# Patient Record
Sex: Female | Born: 1950 | Race: White | Hispanic: No | Marital: Married | Smoking: Never smoker
Health system: Southern US, Community
[De-identification: ages and names within clinical notes are randomized; demographics above are authoritative.]

## PROBLEM LIST (undated history)

## (undated) DIAGNOSIS — K222 Esophageal obstruction: Secondary | ICD-10-CM

## (undated) DIAGNOSIS — I498 Other specified cardiac arrhythmias: Secondary | ICD-10-CM

## (undated) DIAGNOSIS — R87619 Unspecified abnormal cytological findings in specimens from cervix uteri: Secondary | ICD-10-CM

## (undated) DIAGNOSIS — Z87440 Personal history of urinary (tract) infections: Secondary | ICD-10-CM

## (undated) DIAGNOSIS — M1990 Unspecified osteoarthritis, unspecified site: Secondary | ICD-10-CM

## (undated) DIAGNOSIS — N809 Endometriosis, unspecified: Secondary | ICD-10-CM

## (undated) DIAGNOSIS — K589 Irritable bowel syndrome without diarrhea: Secondary | ICD-10-CM

## (undated) DIAGNOSIS — IMO0002 Reserved for concepts with insufficient information to code with codable children: Secondary | ICD-10-CM

## (undated) DIAGNOSIS — Z800 Family history of malignant neoplasm of digestive organs: Secondary | ICD-10-CM

## (undated) DIAGNOSIS — K219 Gastro-esophageal reflux disease without esophagitis: Secondary | ICD-10-CM

## (undated) DIAGNOSIS — D649 Anemia, unspecified: Secondary | ICD-10-CM

## (undated) DIAGNOSIS — K449 Diaphragmatic hernia without obstruction or gangrene: Secondary | ICD-10-CM

## (undated) DIAGNOSIS — D219 Benign neoplasm of connective and other soft tissue, unspecified: Secondary | ICD-10-CM

## (undated) HISTORY — DX: Reserved for concepts with insufficient information to code with codable children: IMO0002

## (undated) HISTORY — PX: ERCP: SHX60

## (undated) HISTORY — DX: Benign neoplasm of connective and other soft tissue, unspecified: D21.9

## (undated) HISTORY — DX: Unspecified abnormal cytological findings in specimens from cervix uteri: R87.619

## (undated) HISTORY — DX: Personal history of urinary (tract) infections: Z87.440

## (undated) HISTORY — DX: Gastro-esophageal reflux disease without esophagitis: K21.9

## (undated) HISTORY — DX: Anemia, unspecified: D64.9

## (undated) HISTORY — PX: TONSILLECTOMY AND ADENOIDECTOMY: SUR1326

## (undated) HISTORY — PX: UPPER GASTROINTESTINAL ENDOSCOPY: SHX188

## (undated) HISTORY — DX: Esophageal obstruction: K22.2

## (undated) HISTORY — DX: Diaphragmatic hernia without obstruction or gangrene: K44.9

## (undated) HISTORY — PX: COLONOSCOPY: SHX174

## (undated) HISTORY — DX: Irritable bowel syndrome, unspecified: K58.9

## (undated) HISTORY — DX: Family history of malignant neoplasm of digestive organs: Z80.0

## (undated) HISTORY — DX: Endometriosis, unspecified: N80.9

## (undated) HISTORY — DX: Other specified cardiac arrhythmias: I49.8

## (undated) HISTORY — PX: LAPAROSCOPY: SHX197

## (undated) HISTORY — DX: Unspecified osteoarthritis, unspecified site: M19.90

---

## 1984-10-05 HISTORY — PX: CHOLECYSTECTOMY: SHX55

## 1999-12-24 ENCOUNTER — Other Ambulatory Visit: Admission: RE | Admit: 1999-12-24 | Discharge: 1999-12-24 | Payer: Self-pay | Admitting: *Deleted

## 2000-03-21 ENCOUNTER — Ambulatory Visit (HOSPITAL_BASED_OUTPATIENT_CLINIC_OR_DEPARTMENT_OTHER): Admission: RE | Admit: 2000-03-21 | Discharge: 2000-03-21 | Payer: Self-pay | Admitting: Internal Medicine

## 2001-02-09 ENCOUNTER — Other Ambulatory Visit: Admission: RE | Admit: 2001-02-09 | Discharge: 2001-02-09 | Payer: Self-pay | Admitting: *Deleted

## 2002-02-13 ENCOUNTER — Other Ambulatory Visit: Admission: RE | Admit: 2002-02-13 | Discharge: 2002-02-13 | Payer: Self-pay | Admitting: *Deleted

## 2002-02-23 ENCOUNTER — Other Ambulatory Visit: Admission: RE | Admit: 2002-02-23 | Discharge: 2002-02-23 | Payer: Self-pay | Admitting: *Deleted

## 2003-02-15 ENCOUNTER — Other Ambulatory Visit: Admission: RE | Admit: 2003-02-15 | Discharge: 2003-02-15 | Payer: Self-pay | Admitting: *Deleted

## 2004-02-20 ENCOUNTER — Other Ambulatory Visit: Admission: RE | Admit: 2004-02-20 | Discharge: 2004-02-20 | Payer: Self-pay | Admitting: *Deleted

## 2005-02-24 ENCOUNTER — Other Ambulatory Visit: Admission: RE | Admit: 2005-02-24 | Discharge: 2005-02-24 | Payer: Self-pay | Admitting: *Deleted

## 2005-06-11 ENCOUNTER — Ambulatory Visit: Payer: Self-pay

## 2005-07-09 ENCOUNTER — Ambulatory Visit: Payer: Self-pay | Admitting: Gastroenterology

## 2005-07-22 ENCOUNTER — Ambulatory Visit: Payer: Self-pay | Admitting: Gastroenterology

## 2005-08-25 ENCOUNTER — Ambulatory Visit: Payer: Self-pay | Admitting: Gastroenterology

## 2005-09-29 ENCOUNTER — Ambulatory Visit: Payer: Self-pay | Admitting: Gastroenterology

## 2005-10-09 ENCOUNTER — Ambulatory Visit: Payer: Self-pay | Admitting: Gastroenterology

## 2006-03-02 ENCOUNTER — Other Ambulatory Visit: Admission: RE | Admit: 2006-03-02 | Discharge: 2006-03-02 | Payer: Self-pay | Admitting: Obstetrics & Gynecology

## 2007-05-16 ENCOUNTER — Other Ambulatory Visit: Admission: RE | Admit: 2007-05-16 | Discharge: 2007-05-16 | Payer: Self-pay | Admitting: Obstetrics and Gynecology

## 2008-05-30 ENCOUNTER — Other Ambulatory Visit: Admission: RE | Admit: 2008-05-30 | Discharge: 2008-05-30 | Payer: Self-pay | Admitting: Obstetrics & Gynecology

## 2008-05-31 ENCOUNTER — Ambulatory Visit: Payer: Self-pay | Admitting: Cardiovascular Disease

## 2008-06-07 ENCOUNTER — Ambulatory Visit: Payer: Self-pay | Admitting: Cardiovascular Disease

## 2008-06-07 ENCOUNTER — Encounter: Payer: Self-pay | Admitting: Cardiovascular Disease

## 2008-06-07 ENCOUNTER — Ambulatory Visit: Payer: Self-pay

## 2008-06-07 LAB — CONVERTED CEMR LAB
Free T4: 1 ng/dL (ref 0.6–1.6)
TSH: 2.27 microintl units/mL (ref 0.35–5.50)

## 2008-06-19 ENCOUNTER — Ambulatory Visit: Payer: Self-pay | Admitting: Cardiovascular Disease

## 2008-10-16 ENCOUNTER — Telehealth: Payer: Self-pay | Admitting: Gastroenterology

## 2008-10-17 DIAGNOSIS — K222 Esophageal obstruction: Secondary | ICD-10-CM

## 2008-10-17 DIAGNOSIS — K219 Gastro-esophageal reflux disease without esophagitis: Secondary | ICD-10-CM

## 2008-10-17 DIAGNOSIS — K449 Diaphragmatic hernia without obstruction or gangrene: Secondary | ICD-10-CM | POA: Insufficient documentation

## 2008-10-17 DIAGNOSIS — N809 Endometriosis, unspecified: Secondary | ICD-10-CM | POA: Insufficient documentation

## 2008-10-17 DIAGNOSIS — I498 Other specified cardiac arrhythmias: Secondary | ICD-10-CM | POA: Insufficient documentation

## 2008-10-18 ENCOUNTER — Ambulatory Visit: Payer: Self-pay | Admitting: Gastroenterology

## 2008-10-23 ENCOUNTER — Telehealth: Payer: Self-pay | Admitting: Gastroenterology

## 2008-11-06 ENCOUNTER — Telehealth: Payer: Self-pay | Admitting: Gastroenterology

## 2008-11-08 ENCOUNTER — Encounter: Payer: Self-pay | Admitting: Gastroenterology

## 2009-01-31 ENCOUNTER — Encounter: Payer: Self-pay | Admitting: Cardiovascular Disease

## 2009-02-01 ENCOUNTER — Encounter: Payer: Self-pay | Admitting: Cardiovascular Disease

## 2009-03-09 ENCOUNTER — Encounter: Payer: Self-pay | Admitting: Cardiovascular Disease

## 2010-11-02 LAB — CONVERTED CEMR LAB
BUN: 19 mg/dL
Glucose, Bld: 79 mg/dL
Potassium: 4.1 meq/L
Sodium: 140 meq/L

## 2011-02-17 NOTE — Assessment & Plan Note (Signed)
[facility]                            CARDIOLOGY OFFICE NOTE   Sherry, Roman                         MRN:          [id number]  DATE:06/19/2008                            DOB:          1951/03/25    HISTORY OF PRESENT ILLNESS:  Sherry Roman is a pleasant 60 year old  Caucasian female with no significant past medical history other than an  esophageal stricture requiring dilatation who presented to my office on  May 31, 2008, with complaints of an awareness of palpitations.  She  stated at that time that her symptoms had been occurring  1-2 times per  month, but  recently have been occurring on a daily basis.  She  describes the sensation of her heart flipping and then a sudden feeling  of fullness in her chest.  The palpitations had been occurring once per  day over the several weeks prior to her last visit.  She also noted  occasional sharp chest pain that lasted a few seconds, but had no other  associated symptoms.  She returns today for followup of her Holter  monitor and her echocardiogram.  Today, she states that she continues to  be aware of palpitations several times per week.  These episodes have  not been as frequent as they were.  She denies any chest pain, shortness  of breath, dizziness, near syncope, syncope, orthopnea, PND, or lower  extremity edema.  She states that she has tried to change her intake of  caffeinated beverages and only drinks decaffeinated beverages at home.  She does occasionally use caffeine when she is away from home.  She  continues to deny any other intake of supplemental drugs that might  cause tachycardia.   Her past medical history is unchanged from prior visit.   CURRENT MEDICATIONS:  1. Glucosamine chondroitin once daily.  2. Multivitamin once daily.  3. Fish oil once daily.  4. Vitamin E once daily.  5. Vitamin D once weekly.  6. Omeprazole 20 mg once daily.  7. Lutein 20 mg once daily.  8. Prometrium  200 mg once daily.  9. Premarin 0.625 of the cream once weekly.   REVIEW OF SYSTEMS:  As stated in the history of present illness is  otherwise negative.   PHYSICAL EXAMINATION:  GENERAL:  She is a pleasant middle-aged Caucasian  female in no acute distress.  VITAL SIGNS:  Weight is 139 pounds, blood pressure 104/72, pulse is 72  and regular, respirations are 12 and nonlabored.  NECK:  No JVD.  No carotid bruits.  SKIN:  Warm and dry.  OROPHARYNX:  Clear.  Mucous membranes are moist.  LUNGS:  Clear to auscultation bilaterally without wheezes, rhonchi, or  crackles noted.  CARDIOVASCULAR:  Regular rate and rhythm without murmurs, gallops, or  rubs noted.  ABDOMEN:  Soft and nontender.  Bowel sounds are present.  EXTREMITIES:  No evidence of edema.  Pulses are 2+ in all extremities.   DIAGNOSTIC STUDIES:  1. A 2-D surface echocardiogram performed on June 07, 2008, shows      normal left ventricular  systolic function with an ejection fraction      of 55-60%.  There were no regional wall motion abnormalities noted.      There was a small pericardial effusion noted.  There was no      significant valvular disease noted.  2. Under diagnostic studies, 24-hour Holter monitor performed on      June 07, 2008, until June 08, 2008, shows episodes of sinus      tachycardia with occasional premature atrial complexes.  There was      no ventricular ectopy noted on this study.  3. Under diagnostic studies, thyroid function test performed on      June 07, 2008, show a free T4 of 1.0 and a TSH of 2.227, both      of which were in normal range.   ASSESSMENT AND PLAN:  This is a pleasant 60 year old Caucasian female  who is being evaluated for complaints of palpitations.  Her Holter  monitor demonstrated sinus tachycardia and occasional premature atrial  contractions.  Her echocardiogram shows no evidence of left ventricular  dysfunction or valvular disease.  I think at this point,  it is most  appropriate to continue to monitor her.  I do not feel that there is any  malignant etiology of her palpitations.  She is to document the episodes  that she has and co-ordinate that with her recent intake of caffeinated  beverages and stress level.  I have also asked her to check her pulse at  times of palpitations.  It may be that she is having an awareness of  sinus tachycardia that is normal response to stressors in her  environment.  I would like to see her back in my office in 4 months.  I  will make no medication changes at the current time.     Verne Carrow, MD  Electronically Signed    CM/MedQ  DD: 06/19/2008  DT: 06/20/2008  Job #: [id number]   cc:   M. Leda Quail, MD

## 2011-02-17 NOTE — Assessment & Plan Note (Signed)
[facility]                            CARDIOLOGY OFFICE NOTE   JENINE, KRISHER                         MRN:          [id number]  DATE:05/31/2008                            DOB:          1951-02-22    HISTORY OF PRESENT ILLNESS:  Sherry Roman is a pleasant 60 year old  Caucasian female with no significant past medical history other than an  esophageal stricture requiring dilatation, who is now postmenopausal and  is referred for complaints of palpitations.  The patient has a history  of palpitations and in 2004 had a 24-hour Holter monitor, which showed  benign PVCs and PACs.  She also had a stress Myoview performed in 2006  that showed no evidence of coronary ischemia.  Her symptoms had been  occurring 1 to 2 times per month but recently have been occurring on a  daily basis.  She describes these episodes as a feeling of her heart  flipping and then a sudden feeling of fullness in her chest.  These  symptoms generally only occur 1 time per day.  She can note no recent  stressful events and has not increased her intake of alcohol or  caffeinated beverages recently.  She does note 1 episode of a sharp  stabbing chest pain that lasted for a few seconds but was fleeting.  There is no history of chest pain or shortness of breath with exertion.  She also denies diaphoresis, nausea, vomiting, near syncope, syncope,  orthopnea, PND, or lower extremity edema.  She has no pain in her legs  when she walks long distances.   Her past medical history is only significant for an esophageal stricture  dilation.  The patient is postmenopausal and is on hormone replacement  therapy.  She denies any history of hypertension, hyperlipidemia,  diabetes mellitus, or coronary artery disease.   Past surgical history, laparoscopic cholecystectomy in 1986.   ALLERGIES:  NSAIDS, MONISTAT.   MEDICATIONS:  1. Glucosamine/chondroitin once daily.  2. Multivitamin once daily.  3. Fish oil 1200 mg once daily.  4. Vitamin E 400 units once daily.  5. Vitamin D 50,000 units weekly.  6. Omeprazole 20 mg once daily.  7. Lutein 20 mg once daily.  8. Prometrium 200 mg once daily.  9. Premarin 0.625 cream weekly.   SOCIAL HISTORY:  The patient is married and has 3 children; 2 of the  children are biological and the other is a stepchild.  She denies use of  tobacco or illicit drugs.  She does admit to social alcohol use.  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   FAMILY HISTORY:  The patient's father had a history of kidney stones,  and her mother has coronary artery disease with a CABG performed at the  age of 71.  There is a strong history of coronary artery disease  otherwise in her family including grandparents.   REVIEW OF SYSTEMS:  As stated in history of present illness and is  otherwise negative.   PHYSICAL EXAMINATION:  GENERAL:  She is a pleasant, middle-aged  Caucasian female in no acute distress.  VITAL SIGNS:  Blood pressure 100/74, pulse 88 and regular, respirations  12 and unlabored, weight 138 pounds.  NECK:  No JVD.  No carotid bruits.  No lymphadenopathy.  No thyromegaly.  SKIN:  Warm and dry.  HEENT:  Oropharynx, mucous membranes are moist.  LUNGS:  Clear to auscultation bilaterally without wheezes, rhonchi, or  crackles noted.  CARDIOVASCULAR:  Regular rate and rhythm without murmurs, gallops, or  rubs noted.  ABDOMEN:  Soft, nontender, and nondistended.  Bowel sounds were present.  No organomegaly.  EXTREMITIES:  No evidence of edema.  Pulses are 2+ in all distal  extremities.   DIAGNOSTIC STUDIES:  A 12-lead electrocardiogram shows normal sinus  rhythm with a ventricular rate of 88 beats per minute, is otherwise a  normal EKG.  All intervals are noted to be normal.   ASSESSMENT AND PLAN:  This is a pleasant 60 year old Caucasian female  who has no significant past medical history but  did have an evaluation 5  years ago for complaints of palpitations.  Holter monitor at that time  showed premature ventricular contractions and premature atrial  contractions.  The patient was not started on any medical therapy at  that time.  Her symptoms now seem consistent with continued premature  ventricular contractions or premature atrial contractions.  She has had  no past assessment of her left ventricle other than the nuclear study.  I will get a 2-D surface echocardiogram to rule out structural heart  disease.  I would also like to have the patient wear a 24-hour Holter  monitor to rule out any malignant arrythmias.  I will get a thyroid  function panel also.  We will plan on seeing her back in this office in  2-3 weeks.  I will make no medication changes at this time.     Verne Carrow, MD  Electronically Signed    CM/MedQ  DD: 05/31/2008  DT: 06/01/2008  Job #: [id number]   cc:   M. Leda Quail, MD  Luis Abed, MD, [facility]

## 2011-09-18 ENCOUNTER — Telehealth: Payer: Self-pay | Admitting: Cardiovascular Disease

## 2011-09-18 NOTE — Telephone Encounter (Signed)
New Msg: pt calling stating that she is having a vibration in her abdomen, not GYN related (pt was checked by OBGYN). Pt doesn't know if it is something going on with abdominal aorta. About 2 weeks pt drove 12 hours from [facility]  (possible related to symptoms). Pt doesn't have any dizziness, sob, and or pain. Pt said symptoms occur about every 4-6 seconds. Please return pt call to discuss further.

## 2011-09-18 NOTE — Telephone Encounter (Signed)
Spoke with pt. She reports symptoms as below. Pt drove from [place] on September 05, 2011.  This past Monday--December 10--she noticed vibration type feeling below naval.  Feeling occurs every 4-6 seconds and she notices it more when sitting.  Also becomes more noticeable at end of day. She saw gynecologist today and gyn was unable to feel vibration or palpate anything.  Pt denies any chest pain, shortness of breath, palpitations or any other symptoms.  Pt was last seen by Dr. Clifton James in Sept. 2009.  I suggested to pt she have this checked out by her primary MD and he could then refer her back to Dr. Clifton James if needed.  Pt agreeable with this plan.  She goes to Urgent Care for primary care and will go tomorrow AM.  I offered to make new pt appt for pt with Dr. Clifton James in January but she would like to wait until seen by primary care.

## 2011-11-02 ENCOUNTER — Encounter: Payer: Self-pay | Admitting: Gastroenterology

## 2011-11-12 ENCOUNTER — Encounter: Payer: Self-pay | Admitting: *Deleted

## 2011-11-17 ENCOUNTER — Ambulatory Visit (INDEPENDENT_AMBULATORY_CARE_PROVIDER_SITE_OTHER): Payer: BC Managed Care – PPO | Admitting: Gastroenterology

## 2011-11-17 ENCOUNTER — Encounter: Payer: Self-pay | Admitting: Gastroenterology

## 2011-11-17 DIAGNOSIS — K5900 Constipation, unspecified: Secondary | ICD-10-CM

## 2011-11-17 DIAGNOSIS — K219 Gastro-esophageal reflux disease without esophagitis: Secondary | ICD-10-CM

## 2011-11-17 DIAGNOSIS — Z800 Family history of malignant neoplasm of digestive organs: Secondary | ICD-10-CM | POA: Insufficient documentation

## 2011-11-17 MED ORDER — PEG-KCL-NACL-NASULF-NA ASC-C 100 G PO SOLR: 1.0000 | Freq: Once | ORAL | Status: DC

## 2011-11-17 NOTE — Progress Notes (Signed)
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 I did screening colonoscopy 5 years ago which was unremarkable. She continues with some gas, bloating, and mild constipation exacerbated by by mouth calcium intake nominal and partially relieved by magnesium and fish oil. The patient denies rectal pain or rectal bleeding, anorexia, weight loss, rather systemic complaints. She does have chronic GERD with a previous negative endoscopy 5 years ago. All of her reflux symptoms are managed with Nexium 40 mg every morning. She specifically denies dysphagia or any hepatobiliary complaints, or history of hepatitis or pancreatitis. Apparently her labs are checked yearly by Dr. Leda Quail, gynecology. Patient has had previous laparoscopy for endometriosis.  Sherry Roman is worried about her constipation, worries exacerbated by the recent death of her father from perforated diverticulitis. He lived in [place]. There apparently is no family history of colon cancer besides a great aunt.  I have reviewed this patient's present history, medical and surgical past history, allergies and medications.    Allergies  Allergen Reactions  . Monistat   . Nsaids     CANNOT HAVE HIGH DOSES    No outpatient prescriptions prior to visit.   Past Medical History  Diagnosis Date  . Esophageal reflux   . Hiatal hernia   . Esophageal stricture   . Family history of malignant neoplasm of gastrointestinal tract   . Endometriosis, site unspecified   . Other specified cardiac dysrhythmias   . Anemia     hx of   . Irritable bowel syndrome (IBS)   . Hx: UTI (urinary tract infection)    Past Surgical History  Procedure Date  . Tonsillectomy and adenoidectomy   . Cholecystectomy 1986   History   Social History  . Marital Status: Married    Spouse Name: Brett Canales    Number of Children: 3  . Years of Education: N/A   Occupational  History  . RN  [employer]   Social History Main Topics  . Smoking status: Never Smoker   . Smokeless tobacco: Never Used  . Alcohol Use: Yes     2-3 a week   . Drug Use: No  . Sexually Active: None   Other Topics Concern  . None   Social History Narrative   Caffeine 3-4 drinks a week    Family History  Problem Relation Age of Onset  . Colon cancer Other     maternal great aunt  . Colon polyps Mother   . Uterine cancer Paternal Aunt   . Breast cancer Other     maternal great aunt  . Heart disease Mother   . Heart disease Maternal Aunt   . Heart disease Maternal Uncle   . Stomach cancer Maternal Aunt        ROS: The remainder of the 10 point ROS is negative     Physical Exam: General well developed well nourished patient in no acute distress, appearing her stated age Eyes PERRLA, no icterus, fundoscopic exam per opthamologist Skin no lesions noted Neck supple, no adenopathy, no thyroid enlargement, no tenderness Chest clear to percussion and auscultation Heart no significant murmurs, gallops or rubs noted Abdomen no hepatosplenomegaly masses or tenderness, BS normal.  Extremities no acute joint lesions, edema, phlebitis or evidence of cellulitis. Neurologic patient oriented x 3, cranial nerves intact, no focal neurologic deficits noted. Psychological mental status normal and normal affect.  Assessment and plan: Chronic constipation  exacerbated by by mouth calcium supplementation. The patient is concerned about developing diverticulosis, colon cancer, or other causes of constipation.I have scheduled  followup colonoscopy at her convenience with propofol sedation. She is to continue a high fiber diet, liberal by mouth fluids, and I have advised daily Metamucil therapy. We reviewed a reflux regime and will continue daily Nexium 40 mg 30 minutes before the first meal of the day. The patient denies upper GI complaints or dysphagia or hepatobiliary symptoms. Review  of her colonoscopy report shows a very long and redundant colon. Also, her mother seems to have had colon polyps.Previous cardiac evaluation for palpitations was unremarkable. This patient is status post cholecystectomy.

## 2011-11-17 NOTE — Patient Instructions (Signed)
Your procedure has been scheduled for 12/04/2011, please follow the seperate instructions.  Your prescription(s) have been sent to you pharmacy.   

## 2011-12-04 ENCOUNTER — Ambulatory Visit (AMBULATORY_SURGERY_CENTER): Payer: BC Managed Care – PPO | Admitting: Gastroenterology

## 2011-12-04 ENCOUNTER — Encounter: Payer: Self-pay | Admitting: Gastroenterology

## 2011-12-04 DIAGNOSIS — Z800 Family history of malignant neoplasm of digestive organs: Secondary | ICD-10-CM

## 2011-12-04 DIAGNOSIS — K5900 Constipation, unspecified: Secondary | ICD-10-CM | POA: Insufficient documentation

## 2011-12-04 DIAGNOSIS — K5730 Diverticulosis of large intestine without perforation or abscess without bleeding: Secondary | ICD-10-CM | POA: Insufficient documentation

## 2011-12-04 MED ORDER — SODIUM CHLORIDE 0.9 % IV SOLN: 500.0000 mL | INTRAVENOUS | Status: DC

## 2011-12-04 NOTE — Op Note (Signed)
[facility] [address] [place], [place]  [address]  COLONOSCOPY PROCEDURE REPORT  PATIENT:  Sherry Roman, Sherry Roman  MR#:  [id number] BIRTHDATE:  1950/10/08, 61 yrs. old  GENDER:  female ENDOSCOPIST:  Vania Rea. Jarold Motto, MD, [facility] REF. BY: PROCEDURE DATE:  12/04/2011 PROCEDURE:  Colonoscopy 19147 ASA CLASS:  Class II INDICATIONS:  Colorectal cancer screening, average risk MEDICATIONS:   propofol (Diprivan) 350 mg  DESCRIPTION OF PROCEDURE:   After the risks and benefits and of the procedure were explained, informed consent was obtained. Digital rectal exam was performed and revealed no abnormalities. The LB 180AL [id number] endoscope was introduced through the anus and advanced to the cecum, which was identified by both the appendix and ileocecal valve.  The quality of the prep was excellent, using MoviPrep.  The instrument was then slowly withdrawn as the colon was fully examined. <<PROCEDUREIMAGES>>  FINDINGS:  There were mild diverticular changes in left colon. diverticulosis was found. VERY REDUNDANT AND TORTUOUS COLON NOTED. Retroflexed views in the rectum revealed no abnormalities.    The scope was then withdrawn from the patient and the procedure completed.  COMPLICATIONS:  None ENDOSCOPIC IMPRESSION: 1) Diverticulosis,mild,left sided diverticulosis 2) No polyps or cancers RECOMMENDATIONS: 1) High fiber diet. 2) Repeat Colonoscopy in 5 years.  REPEAT EXAM:  No  ______________________________ Vania Rea. Jarold Motto, MD, [facility]  CC:  Robert Bellow, MD  n. Rosalie Doctor:   Vania Rea. Kyvon Hu at 12/04/2011 02:07 PM  Norberta Keens, [phone]

## 2011-12-04 NOTE — Progress Notes (Signed)
Propofol was administered by Sheila Camp, CRNA. Maw  The pt tolerated the colonoscopy very well. Maw   

## 2011-12-04 NOTE — Patient Instructions (Signed)
YOU HAD AN ENDOSCOPIC PROCEDURE TODAY AT THE [facility]: Refer to the procedure report that was given to you for any specific questions about what was found during the examination.  If the procedure report does not answer your questions, please call your gastroenterologist to clarify.  If you requested that your care partner not be given the details of your procedure findings, then the procedure report has been included in a sealed envelope for you to review at your convenience later.  YOU SHOULD EXPECT: Some feelings of bloating in the abdomen. Passage of more gas than usual.  Walking can help get rid of the air that was put into your GI tract during the procedure and reduce the bloating. If you had a lower endoscopy (such as a colonoscopy or flexible sigmoidoscopy) you may notice spotting of blood in your stool or on the toilet paper. If you underwent a bowel prep for your procedure, then you may not have a normal bowel movement for a few days.  DIET: Your first meal following the procedure should be a light meal and then it is ok to progress to your normal diet.  A half-sandwich or bowl of soup is an example of a good first meal.  Heavy or fried foods are harder to digest and may make you feel nauseous or bloated.  Likewise meals heavy in dairy and vegetables can cause extra gas to form and this can also increase the bloating.  Drink plenty of fluids but you should avoid alcoholic beverages for 24 hours.  ACTIVITY: Your care partner should take you home directly after the procedure.  You should plan to take it easy, moving slowly for the rest of the day.  You can resume normal activity the day after the procedure however you should NOT DRIVE or use heavy machinery for 24 hours (because of the sedation medicines used during the test).    SYMPTOMS TO REPORT IMMEDIATELY: A gastroenterologist can be reached at any hour.  During normal business hours, 8:30 AM to 5:00 PM Monday through Friday,  call [phone].  After hours and on weekends, please call the GI answering service at [phone] who will take a message and have the physician on call contact you.   Following lower endoscopy (colonoscopy or flexible sigmoidoscopy):  Excessive amounts of blood in the stool  Significant tenderness or worsening of abdominal pains  Swelling of the abdomen that is new, acute  Fever of 100F or higher  FOLLOW UP:  Our staff will call the home number listed on your records the next business day following your procedure to check on you and address any questions or concerns that you may have at that time regarding the information given to you following your procedure. This is a courtesy call and so if there is no answer at the home number and we have not heard from you through the emergency physician on call, we will assume that you have returned to your regular daily activities without incident.  SIGNATURES/CONFIDENTIALITY: You and/or your care partner have signed paperwork which will be entered into your electronic medical record.  These signatures attest to the fact that that the information above on your After Visit Summary has been reviewed and is understood.  Full responsibility of the confidentiality of this discharge information lies with you and/or your care-partner.   Please read information given about diverticulosis  Follow high fiber diet- see handout  Follow up colonoscopy in 5 years

## 2011-12-04 NOTE — Progress Notes (Signed)
Patient did not experience any of the following events: a burn prior to discharge; a fall within the facility; wrong site/side/patient/procedure/implant event; or a hospital transfer or hospital admission upon discharge from the facility. (G8907) Patient did not have preoperative order for IV antibiotic SSI prophylaxis. (G8918)  

## 2011-12-07 ENCOUNTER — Telehealth: Payer: Self-pay | Admitting: *Deleted

## 2011-12-07 NOTE — Telephone Encounter (Signed)
  Follow up Call-  Call back number 12/04/2011  Post procedure Call Back phone  # [phone]  Permission to leave phone message Yes     Patient questions:  Do you have a fever, pain , or abdominal swelling? no Pain Score  0 *  Have you tolerated food without any problems? no  Have you been able to return to your normal activities? yes  Do you have any questions about your discharge instructions: Diet   no Medications  no Follow up visit  no  Do you have questions or concerns about your Care? no  Actions: * If pain score is 4 or above: No action needed, pain <4.

## 2012-08-15 ENCOUNTER — Other Ambulatory Visit: Payer: Self-pay | Admitting: Gastroenterology

## 2012-08-15 MED ORDER — ESOMEPRAZOLE MAGNESIUM 40 MG PO CPDR: 40.0000 mg | DELAYED_RELEASE_CAPSULE | Freq: Every day | ORAL | Status: DC

## 2012-08-15 NOTE — Telephone Encounter (Signed)
Nexium sent to [facility] per patient request

## 2012-08-30 ENCOUNTER — Ambulatory Visit (INDEPENDENT_AMBULATORY_CARE_PROVIDER_SITE_OTHER): Payer: BC Managed Care – PPO | Admitting: Family Medicine

## 2012-08-30 DIAGNOSIS — IMO0001 Reserved for inherently not codable concepts without codable children: Secondary | ICD-10-CM

## 2012-08-30 DIAGNOSIS — M791 Myalgia, unspecified site: Secondary | ICD-10-CM
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
## 2012-09-17 ENCOUNTER — Ambulatory Visit (INDEPENDENT_AMBULATORY_CARE_PROVIDER_SITE_OTHER): Payer: BC Managed Care – PPO | Admitting: Emergency Medicine

## 2012-09-17 ENCOUNTER — Ambulatory Visit (HOSPITAL_COMMUNITY)
Admission: RE | Admit: 2012-09-17 | Discharge: 2012-09-17 | Disposition: A | Discharge status: Home / Self Care | Payer: BC Managed Care – PPO | Source: Ambulatory Visit | Attending: Emergency Medicine | Admitting: Emergency Medicine

## 2012-09-17 ENCOUNTER — Ambulatory Visit: Payer: BC Managed Care – PPO

## 2012-09-17 DIAGNOSIS — M549 Dorsalgia, unspecified: Secondary | ICD-10-CM

## 2012-09-17 DIAGNOSIS — H538 Other visual disturbances: Secondary | ICD-10-CM

## 2012-09-17 DIAGNOSIS — M542 Cervicalgia: Secondary | ICD-10-CM

## 2012-09-17 DIAGNOSIS — M546 Pain in thoracic spine: Secondary | ICD-10-CM

## 2012-09-17 MED ORDER — CYCLOBENZAPRINE HCL 10 MG PO TABS: ORAL_TABLET | ORAL | Status: DC
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
## 2012-09-19 ENCOUNTER — Telehealth: Payer: Self-pay | Admitting: Radiology

## 2012-09-19 NOTE — Telephone Encounter (Signed)
Needs peer to peer for [employer] to auth MRI  C spine  [phone] member #[id number]

## 2012-09-20 NOTE — Telephone Encounter (Signed)
Authorized - [id number] -

## 2012-09-22 ENCOUNTER — Ambulatory Visit
Admission: RE | Admit: 2012-09-22 | Discharge: 2012-09-22 | Disposition: A | Discharge status: Home / Self Care | Payer: BC Managed Care – PPO | Source: Ambulatory Visit | Attending: Emergency Medicine | Admitting: Emergency Medicine

## 2012-09-22 DIAGNOSIS — M549 Dorsalgia, unspecified: Secondary | ICD-10-CM

## 2012-09-22 DIAGNOSIS — H538 Other visual disturbances: Secondary | ICD-10-CM

## 2012-09-29 ENCOUNTER — Telehealth: Payer: Self-pay
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
## 2012-09-29 NOTE — Telephone Encounter (Signed)
See MRI 

## 2012-10-04 ENCOUNTER — Telehealth: Payer: Self-pay | Admitting: Radiology

## 2012-10-04 DIAGNOSIS — M542 Cervicalgia: Secondary | ICD-10-CM

## 2012-10-04 NOTE — Telephone Encounter (Signed)
Patient advised of results of MRI Scan she wants to try PT order entered for her.

## 2012-11-11 ENCOUNTER — Telehealth: Payer: Self-pay

## 2012-11-11 NOTE — Telephone Encounter (Signed)
Patient advised this is at front desk.
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
## 2012-11-13 ENCOUNTER — Telehealth: Payer: Self-pay

## 2012-11-13 NOTE — Telephone Encounter (Signed)
[facility] Xrays are ready to pick. In pick up draw.

## 2012-11-13 NOTE — Telephone Encounter (Signed)
Needs copies of actual xray's of spine, needs to pick them up by tomorrow afternoon at latest.

## 2012-11-19 ENCOUNTER — Other Ambulatory Visit: Payer: Self-pay

## 2012-11-20 ENCOUNTER — Telehealth: Payer: Self-pay

## 2012-11-20 NOTE — Telephone Encounter (Signed)
Patient would like a prescription for six scope patches sent to [facility], [place] [phone].

## 2012-11-21 NOTE — Telephone Encounter (Signed)
Called patient, what does she need? She needs scopalomine patches for seasickness for upcoming trip. Pended please advise

## 2012-11-22 MED ORDER — SCOPOLAMINE 1 MG/3DAYS TD PT72: 1.0000 | MEDICATED_PATCH | TRANSDERMAL | Status: DC

## 2012-11-22 NOTE — Telephone Encounter (Signed)
Patient advised.

## 2012-11-22 NOTE — Telephone Encounter (Signed)
Transdermal scopolamine patch Rx sent to the pharmacy.  Meds ordered this encounter  Medications  . scopolamine (TRANSDERM-SCOP) 1.5 MG    Sig: Place 1 patch (1.5 mg total) onto the skin every 3 (three) days.    Dispense:  4 patch    Refill:  1

## 2013-04-26 ENCOUNTER — Telehealth: Payer: Self-pay

## 2013-04-26 DIAGNOSIS — T753XXD Motion sickness, subsequent encounter: Secondary | ICD-10-CM

## 2013-04-26 MED ORDER — SCOPOLAMINE 1 MG/3DAYS TD PT72: 1.0000 | MEDICATED_PATCH | TRANSDERMAL | Status: DC

## 2013-04-26 NOTE — Telephone Encounter (Signed)
Please advise 

## 2013-04-26 NOTE — Telephone Encounter (Signed)
Sent in

## 2013-04-26 NOTE — Telephone Encounter (Signed)
Pt would like to know if she could have scopolamine patches called in for sea sickness, she will be going scuba diving soon. Best# [phone]

## 2013-04-27 ENCOUNTER — Other Ambulatory Visit: Payer: Self-pay

## 2013-04-27 DIAGNOSIS — T753XXD Motion sickness, subsequent encounter: Secondary | ICD-10-CM

## 2013-04-27 MED ORDER — SCOPOLAMINE 1 MG/3DAYS TD PT72: 1.0000 | MEDICATED_PATCH | TRANSDERMAL | Status: DC

## 2013-05-04 NOTE — Telephone Encounter (Signed)
PA was denied for pt to get the patches at local pharmacy but no PA is needed if pt gets them from [facility]. Pt stated that is fine because she doesn't need them until Oct. I resent Rx to [facility]. I later received letter stating that the patches are on backorder at [facility], but hopefully they will receive them by time needed.

## 2013-05-08 ENCOUNTER — Telehealth: Payer: Self-pay

## 2013-05-08 NOTE — Telephone Encounter (Signed)
PT HAVE QUESTIONS REGARDING HER MEDICINE. THE MESSAGE WAS SO LONG UNTIL PT WAS TOLD SOMEONE WOULD CALL HER BACK. PLEASE CALL [phone]
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
## 2013-05-30 DIAGNOSIS — Z0271 Encounter for disability determination: Secondary | ICD-10-CM

## 2013-06-28 ENCOUNTER — Encounter: Payer: Self-pay | Admitting: Certified Nurse Midwife

## 2013-06-29 ENCOUNTER — Ambulatory Visit: Payer: BC Managed Care – PPO | Admitting: Certified Nurse Midwife

## 2013-07-04 ENCOUNTER — Ambulatory Visit: Payer: BC Managed Care – PPO | Admitting: Certified Nurse Midwife

## 2013-08-08 ENCOUNTER — Ambulatory Visit (INDEPENDENT_AMBULATORY_CARE_PROVIDER_SITE_OTHER): Admitting: Certified Nurse Midwife

## 2013-08-08 ENCOUNTER — Encounter: Payer: Self-pay | Admitting: Certified Nurse Midwife

## 2013-08-08 VITALS — BP 104/64 | HR 80 | Resp 16 | Ht 65.25 in | Wt 155.0 lb

## 2013-08-08 DIAGNOSIS — Z0000 Encounter for general adult medical examination without abnormal findings: Secondary | ICD-10-CM

## 2013-08-08 DIAGNOSIS — Z01419 Encounter for gynecological examination (general) (routine) without abnormal findings: Secondary | ICD-10-CM

## 2013-08-08 DIAGNOSIS — N952 Postmenopausal atrophic vaginitis: Secondary | ICD-10-CM

## 2013-08-08 LAB — HEMOGLOBIN, FINGERSTICK: Hemoglobin, fingerstick: 13.3 g/dL (ref 12.0–16.0)

## 2013-08-08 LAB — POCT URINALYSIS DIPSTICK
Bilirubin, UA: NEGATIVE
Glucose, UA: NEGATIVE
Ketones, UA: NEGATIVE
Leukocytes, UA: NEGATIVE

## 2013-08-08 MED ORDER — CLOBETASOL PROPIONATE 0.05 % EX OINT: 1.0000 "application " | TOPICAL_OINTMENT | CUTANEOUS | Status: AC | PRN

## 2013-08-08 MED ORDER — ESTRADIOL 10 MCG VA TABS: 1.0000 | ORAL_TABLET | VAGINAL | Status: DC

## 2013-08-08 NOTE — Progress Notes (Signed)
62 y.o. [id number] Married Caucasian Fe here for annual exam. Menopausal no HRT. Denies vaginal bleeding or dryness.Using Vagifem with good success. Social stress with mother and selling their home place. No health issues today. Sees PCP prn.   Patient's last menstrual period was 08/06/2003.          Sexually active: yes  The current method of family planning is post menopausal status.    Exercising: yes  jog, house & yardwork Smoker:  no  Health Maintenance: Pap:  06-27-12 neg HPV HR neg MMG:  5/14 neg Colonoscopy:  2013  diverticuli 5 years BMD:   2013 TDaP:  2012 Labs: Poct urine- neg, Hgb 13.3 Self breast exam: done occ   reports that she has never smoked. She has never used smokeless tobacco. She reports that she drinks about 0.5 ounces of alcohol per week. She reports that she does not use illicit drugs.  Past Medical History  Diagnosis Date  . Esophageal reflux   . Hiatal hernia   . Esophageal stricture   . Family history of malignant neoplasm of gastrointestinal tract   . Endometriosis, site unspecified   . Other specified cardiac dysrhythmias(427.89)   . Anemia     hx of   . Irritable bowel syndrome (IBS)   . Hx: UTI (urinary tract infection)   . Arthritis   . Fibroid   . Abnormal Pap smear     5/03 ASCUS neg HPV    Past Surgical History  Procedure Laterality Date  . Tonsillectomy and adenoidectomy    . Cholecystectomy  1986  . Colonoscopy    . Upper gastrointestinal endoscopy    . Ercp    . Laparoscopy      Current Outpatient Prescriptions  Medication Sig Dispense Refill  . Calcium Carbonate-Vitamin D 600-400 MG-UNIT per tablet Take 1 tablet by mouth daily.      . clobetasol ointment (TEMOVATE) 0.05 % Apply 1 application topically as needed.      . ergocalciferol (VITAMIN D2) 50000 UNITS capsule Take 50,000 Units by mouth every 14 (fourteen) days.       Marland Kitchen esomeprazole (NEXIUM) 40 MG capsule Take 1 capsule (40 mg total) by mouth daily before breakfast.  90  capsule  2  . Estradiol (VAGIFEM) 10 MCG TABS vaginal tablet Place vaginally 2 (two) times a week.      . Glucosamine-Chondroit-Vit C-Mn (GLUCOSAMINE-CHONDROITIN) CAPS Take 1 capsule by mouth daily. Takes 1500 mg/1200 mg      . Lutein 20 MG CAPS Take 1 capsule by mouth daily.      . mometasone (NASONEX) 50 MCG/ACT nasal spray Place into the nose as needed.      . Multiple Vitamins-Minerals (SENIOR MULTIVITAMIN PLUS PO) Take 1 tablet by mouth daily.      . OMEGA 3 1200 MG CAPS Take 1 capsule by mouth daily.       No current facility-administered medications for this visit.    Family History  Problem Relation Age of Onset  . Colon polyps Mother   . Heart disease Mother   . Thyroid disease Mother   . Cancer Paternal Aunt     cervical  . Breast cancer Other     maternal great aunt  . Heart disease Maternal Aunt   . Cancer Maternal Aunt     gastric  . Heart disease Maternal Uncle   . Cancer Maternal Uncle     lung  . Cancer Maternal Aunt     leukemia  .  Cancer Father     prostate  . Thyroid disease Father   . Heart disease Maternal Grandfather   . Breast cancer Cousin   . Cancer Maternal Uncle     lung  . Heart disease Maternal Uncle   . Cancer Paternal Aunt     leukemia    ROS:  Pertinent items are noted in HPI.  Otherwise, a comprehensive ROS was negative.  Exam:   BP 104/64  Pulse 80  Resp 16  Ht 5' 5.25" (1.657 m)  Wt 155 lb (70.308 kg)  BMI 25.61 kg/m2  LMP 08/06/2003 Height: 5' 5.25" (165.7 cm)  Ht Readings from Last 3 Encounters:  08/08/13 5' 5.25" (1.657 m)  09/17/12 5' 5.5" (1.664 m)  08/30/12 5\' 5"  (1.651 m)    General appearance: alert, cooperative and appears stated age Head: Normocephalic, without obvious abnormality, atraumatic Neck: no adenopathy, supple, symmetrical, trachea midline and thyroid normal to inspection and palpation Lungs: clear to auscultation bilaterally Breasts: normal appearance, no masses or tenderness, No nipple retraction or  dimpling, No nipple discharge or bleeding, No axillary or supraclavicular adenopathy Heart: regular rate and rhythm Abdomen: soft, non-tender; no masses,  no organomegaly Extremities: extremities normal, atraumatic, no cyanosis or edema Skin: Skin color, texture, turgor normal. No rashes or lesions Lymph nodes: Cervical, supraclavicular, and axillary nodes normal. No abnormal inguinal nodes palpated Neurologic: Grossly normal   Pelvic: External genitalia:  no lesions              Urethra:  normal appearing urethra with no masses, tenderness or lesions              Bartholin's and Skene's: normal                 Vagina: normal appearing vagina with normal color and discharge, no lesions              Cervix: normal, non tender              Pap taken: no Bimanual Exam:  Uterus:  normal size, contour, position, consistency, mobility, non-tender, anteverted and anteflexed              Adnexa: normal adnexa and no mass, fullness, tenderness               Rectovaginal: Confirms               Anus:  normal sphincter tone, no lesions  A:  Well Woman with normal exam  Menopausal no HRT  Atrophic vaginitis with Vagifem use  History of Vitamin D deficiency  Social stress with mother  P:   Reviewed health and wellness pertinent to exam  Aware of need to evaluate if vaginal bleeding  Rx Vagifem see order  Lab: Vitamin D Will do fasting labs at next aex  Encouraged support with family, friends  Pap smear as per guidelines   Mammogram yearly pap smear not taken counseled on breast self exam, mammography screening, menopause, adequate intake of calcium and vitamin D, diet and exercise  return annually or prn  An After Visit Summary was printed and given to the patient.

## 2013-08-08 NOTE — Patient Instructions (Signed)

## 2013-08-09 LAB — VITAMIN D 25 HYDROXY (VIT D DEFICIENCY, FRACTURES): Vit D, 25-Hydroxy: 41 ng/mL (ref 30–89)

## 2013-08-10 ENCOUNTER — Other Ambulatory Visit: Payer: Self-pay

## 2013-08-10 NOTE — Progress Notes (Signed)
Note reviewed, agree with plan.  Lareta Bruneau, MD  

## 2013-08-29 ENCOUNTER — Telehealth: Payer: Self-pay | Admitting: Certified Nurse Midwife

## 2013-08-29 NOTE — Telephone Encounter (Signed)
Results of vitamin d test.

## 2013-08-29 NOTE — Telephone Encounter (Addendum)
       Notes Recorded by Verner Chol, CNM on 08/09/2013 at 8:32 AM Vitamin D within acceptable range, Recommendation is daily Vit. D 3 1000 IU Other the Counter Sent to my chart  Vitamin D =41   Advised patient she does no  longer need 50,000 IU per Verner Chol CNM. Advised message per Eunice Blase. Vitamin D3 1000 iu. Patient is agreeable and verbalized understanding.  Routing to provider for final review. Patient agreeable to disposition. Will close encounter

## 2013-10-04 ENCOUNTER — Other Ambulatory Visit: Payer: Self-pay | Admitting: Gastroenterology
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
## 2013-11-10 ENCOUNTER — Telehealth: Payer: Self-pay | Admitting: Gastroenterology

## 2013-11-10 NOTE — Telephone Encounter (Signed)
Patient states Nexium is not helping. Having severe heartburn, needs advise on what to take and what to do

## 2013-11-10 NOTE — Telephone Encounter (Signed)
Dr Sharlett Iles, [facility] to change to [place]? Thanks.

## 2013-11-13 MED ORDER — DEXLANSOPRAZOLE 60 MG PO CPDR: DELAYED_RELEASE_CAPSULE | ORAL | Status: AC

## 2013-11-13 NOTE — Telephone Encounter (Signed)
Pt states she thinks it's a good idea to try Dexilant x 1 month to see how she does rather than get a 90 day supply through mail order. She will check with [facility] and call me back. She wants the script filled at [facility] and will call be with info as well.

## 2013-11-13 NOTE — Telephone Encounter (Signed)
Dexilant 60 mg/day

## 2013-11-13 NOTE — Telephone Encounter (Signed)
Pt requested I send 90 day supply of Dexilant to [facility]; done.

## 2013-11-13 NOTE — Telephone Encounter (Signed)
lmom for pt to call back

## 2013-12-01 ENCOUNTER — Telehealth: Payer: Self-pay | Admitting: Gastroenterology

## 2013-12-01 NOTE — Telephone Encounter (Signed)
Faxed PA form to [employer] and waiting on response. Pt notified we are awaiting approval from insurance company.

## 2013-12-15 ENCOUNTER — Ambulatory Visit (INDEPENDENT_AMBULATORY_CARE_PROVIDER_SITE_OTHER): Admitting: Internal Medicine

## 2013-12-15 ENCOUNTER — Telehealth: Payer: Self-pay | Admitting: Gastroenterology

## 2013-12-15 VITALS — BP 124/72 | HR 86 | Temp 98.4°F | Resp 17 | Ht 65.5 in | Wt 159.0 lb

## 2013-12-15 DIAGNOSIS — L03317 Cellulitis of buttock: Principal | ICD-10-CM

## 2013-12-15 DIAGNOSIS — L0231 Cutaneous abscess of buttock: Secondary | ICD-10-CM

## 2013-12-15 MED ORDER — DOXYCYCLINE HYCLATE 100 MG PO TABS: 100.0000 mg | ORAL_TABLET | Freq: Two times a day (BID) | ORAL | Status: AC

## 2013-12-15 MED ORDER — DOXYCYCLINE HYCLATE 100 MG PO TABS: 100.0000 mg | ORAL_TABLET | Freq: Two times a day (BID) | ORAL | Status: DC

## 2013-12-15 NOTE — Telephone Encounter (Signed)
Called patient back. I advised patient that I have the [employer] forms that were faxed on 12-01-2013 with no response. I called [phone] number on form that was faxed for providers to call. I advised patient I have attempted to contact them 4 times and each time I am on hold for 30 minutes or longer. I advised patient I even faxed form three times and no response. Patient told me to call [phone]. That number was unsuccessful it brought me to [facility] and [facility] sent me back to [place] number [phone]. I called patient back and advised of my unsuccessful attempt with number given. Patient is also due for an office visit. Last office visit was 2013. Patient said that she cannot make an office appointment because [employer] will not pay for it unless she will have colonoscopy or some other type of procedure. I advised patient that I cannot guarantee if a procedure or imagining will be done, the patient will have to see the doctor or extender to be evaluated then they make that decision, not I. I advised patient that we cannot continue to send medication in and she not be seen because we are obligated to follow up with patients on a 1-2 year basis for refills. Patient verbalized understanding. I asked patient if she can be seen at the [facility] and be treated for GERD. Patient said only her husband not her because he is no longer active. I advised patient to call [employer] and see if she is seen in the office for an issue like GERD/Reflux can she be seen without having to pay out of pocket. Patient said she will call the insurance company and call me back. I advised patient she can purchase Omeprazole 20 mg over the counter and take twice daily to equal 40 mg, or Pepcid. Patient verbalized understanding and will call back.
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
## 2014-01-30 NOTE — Telephone Encounter (Signed)
See next note

## 2014-08-02 ENCOUNTER — Other Ambulatory Visit: Payer: Self-pay | Admitting: Certified Nurse Midwife

## 2014-08-02 NOTE — Telephone Encounter (Signed)
Last AEX and refill 08/08/13 #8/12R Next appt 08/10/14 MMG 03/05/14 BIRADS1: neg  Please advise

## 2014-08-10 ENCOUNTER — Ambulatory Visit: Admitting: Certified Nurse Midwife

## 2014-09-07 ENCOUNTER — Ambulatory Visit (INDEPENDENT_AMBULATORY_CARE_PROVIDER_SITE_OTHER): Admitting: Family Medicine

## 2014-09-07 ENCOUNTER — Encounter: Payer: Self-pay | Admitting: Family Medicine

## 2014-09-07 VITALS — BP 102/70 | HR 74 | Temp 97.7°F | Resp 18 | Ht 65.0 in | Wt 135.0 lb

## 2014-09-07 DIAGNOSIS — J029 Acute pharyngitis, unspecified: Secondary | ICD-10-CM

## 2014-09-07 LAB — POCT RAPID STREP A (OFFICE): Rapid Strep A Screen: NEGATIVE

## 2014-09-07 NOTE — Progress Notes (Signed)
Patient ID: JAN WALTERS MRN: [id number], DOB: Feb 06, 1951, 63 y.o. Date of Encounter: 09/07/2014, 9:53 AM  Primary Physician: Kennon Portela, MD  Chief Complaint:  Chief Complaint  Patient presents with  . Sore Throat    since monday     HPI: 63 y.o. year old female presents with 4 day history of intense sore throat. Subjective fever and chills. No cough, congestion, rhinorrhea, sinus pressure, otalgia, or headache. Normal hearing. No GI complaints. Able to swallow saliva, but hurts to do so. Decreased appetite secondary to sore throat.   Past Medical History  Diagnosis Date  . Esophageal reflux   . Hiatal hernia   . Esophageal stricture   . Family history of malignant neoplasm of gastrointestinal tract   . Endometriosis, site unspecified   . Other specified cardiac dysrhythmias(427.89)   . Anemia     hx of   . Irritable bowel syndrome (IBS)   . Hx: UTI (urinary tract infection)   . Arthritis   . Fibroid   . Abnormal Pap smear     5/03 ASCUS neg HPV     Home Meds: Prior to Admission medications   Medication Sig Start Date End Date Taking? Authorizing Provider  cholecalciferol (VITAMIN D) 1000 UNITS tablet Take 1,000 Units by mouth daily.   Yes Historical Provider, MD  omeprazole (PRILOSEC) 20 MG capsule Take 20 mg by mouth daily.   Yes Historical Provider, MD  Calcium Carbonate-Vitamin D 600-400 MG-UNIT per tablet Take 1 tablet by mouth daily.    Historical Provider, MD  clobetasol ointment (TEMOVATE) 3.53 % Apply 1 application topically as needed. Patient not taking: Reported on 09/07/2014 08/08/13   Regina Eck, CNM  dexlansoprazole [facility]) 60 MG capsule Take one capsule by mouth daily 30 minutes prior to breakfast Patient not taking: Reported on 09/07/2014 11/13/13   Sable Feil, MD  doxycycline (VIBRA-TABS) 100 MG tablet Take 1 tablet (100 mg total) by mouth 2 (two) times daily. Patient not taking: Reported on 09/07/2014 12/15/13   Leandrew Koyanagi, MD  Glucosamine-Chondroit-Vit C-Mn (GLUCOSAMINE-CHONDROITIN) CAPS Take 1 capsule by mouth daily. Takes 1500 mg/1200 mg    Historical Provider, MD  Lutein 20 MG CAPS Take 1 capsule by mouth daily.    Historical Provider, MD  mometasone (NASONEX) 50 MCG/ACT nasal spray Place into the nose as needed.    Historical Provider, MD  Multiple Vitamins-Minerals (SENIOR MULTIVITAMIN PLUS PO) Take 1 tablet by mouth daily.    Historical Provider, MD  NEXIUM 40 MG capsule TAKE 1 CAPSULE DAILY BEFORE BREAKFAST Patient not taking: Reported on 09/07/2014 10/04/13   Sable Feil, MD  OMEGA 3 1200 MG CAPS Take 1 capsule by mouth daily.    Historical Provider, MD  VAGIFEM 10 MCG TABS vaginal tablet INSERT 1 TABLET VAGINALLY TWICE A WEEK Patient not taking: Reported on 09/07/2014 08/02/14   Regina Eck, CNM    Allergies:  Allergies  Allergen Reactions  . Flexeril [Cyclobenzaprine] Itching  . Monistat [Miconazole] Swelling  . Nsaids     CANNOT HAVE HIGH DOSES     History   Social History  . Marital Status: Married    Spouse Name: Richardson Landry    Number of Children: 3  . Years of Education: N/A   Occupational History  . RN  [employer]   Social History Main Topics  . Smoking status: Never Smoker   . Smokeless tobacco: Never Used  . Alcohol Use: 0.5  oz/week    1 drink(s) per week  . Drug Use: No  . Sexual Activity:    Partners: Male    Birth Control/ Protection: Post-menopausal   Other Topics Concern  . Not on file   Social History Narrative   Caffeine 3-4 drinks a week      Review of Systems: Constitutional: negative for chills, fever, night sweats or weight changes HEENT: see above Cardiovascular: negative for chest pain or palpitations Respiratory: negative for hemoptysis, wheezing, or shortness of breath Abdominal: negative for abdominal pain, nausea, vomiting or diarrhea Dermatological: negative for rash Neurologic: negative for headache   Physical  Exam: Blood pressure 102/70, pulse 74, temperature 97.7 F (36.5 C), temperature source Oral, resp. rate 18, height 5\' 5"  (1.651 m), weight 135 lb (61.236 kg), last menstrual period 08/06/2003, SpO2 98 %., Body mass index is 22.47 kg/(m^2). General: Well developed, well nourished, in no acute distress. Head: Normocephalic, atraumatic, eyes without discharge, sclera non-icteric, nares are patent. Bilateral auditory canals clear, TM's are without perforation, pearly grey with reflective cone of light bilaterally. No sinus TTP. Oral cavity moist, dentition normal. Posterior pharynx with post nasal drip and mild erythema. No peritonsillar abscess or tonsillar exudate. Neck: Supple. No thyromegaly. Full ROM. No lymphadenopathy. Lungs: Clear bilaterally to auscultation without wheezes, rales, or rhonchi. Breathing is unlabored. Heart: RRR with S1 S2. No murmurs, rubs, or gallops appreciated. Abdomen: Soft, non-tender, non-distended with normoactive bowel sounds. No hepatomegaly. No rebound/guarding. No obvious abdominal masses. Msk:  Strength and tone normal for age. Extremities: No clubbing or cyanosis. No edema. Neuro: Alert and oriented X 3. Moves all extremities spontaneously. CNII-XII grossly in tact. Psych:  Responds to questions appropriately with a normal affect.   RST negative  ASSESSMENT AND PLAN:  63 y.o. year old female with acute pharyngitis with marked redness right side Given Amox 875 bid and MMW for a week. - -Tylenol/Motrin prn -Rest/fluids -RTC precautions -RTC 3-5 days if no improvement  Signed, Robyn Haber, MD 09/07/2014 9:53 AM

## 2014-11-26 ENCOUNTER — Other Ambulatory Visit: Payer: Self-pay | Admitting: Certified Nurse Midwife

## 2014-11-26 NOTE — Telephone Encounter (Signed)
Medication refill request: Vagifem 10 mcg  Last AEX:  08/08/13 with DL  Next AEX: No AEX scheduled  Last MMG (if hormonal medication request):  n/a Refill authorized: n/a  Called and s/w patient she said she has moved to [place] and will call [facility] and have them contact her GYN there in regards to refill.  Rx denied electronically through our system.

## 2015-05-21 ENCOUNTER — Encounter: Payer: Self-pay | Admitting: Gastroenterology

## 2015-10-23 ENCOUNTER — Encounter: Payer: Self-pay | Admitting: Cardiology

## 2015-11-19 ENCOUNTER — Encounter: Payer: Self-pay | Admitting: Internal Medicine

## 2022-02-17 ENCOUNTER — Encounter: Payer: Self-pay | Admitting: Internal Medicine
